# Patient Record
Sex: Male | Born: 1950 | Race: White | Hispanic: No | Marital: Married | Smoking: Never smoker
Health system: Southern US, Community
[De-identification: ages and names within clinical notes are randomized; demographics above are authoritative.]

## PROBLEM LIST (undated history)

## (undated) DIAGNOSIS — K219 Gastro-esophageal reflux disease without esophagitis: Secondary | ICD-10-CM

---

## 2018-02-07 ENCOUNTER — Emergency Department: Payer: Medicare Other

## 2018-02-07 ENCOUNTER — Encounter: Payer: Self-pay | Admitting: *Deleted

## 2018-02-07 ENCOUNTER — Emergency Department
Admission: EM | Admit: 2018-02-07 | Discharge: 2018-02-07 | Disposition: A | Discharge status: Home / Self Care | Payer: Medicare Other | Attending: Emergency Medicine | Admitting: Emergency Medicine

## 2018-02-07 ENCOUNTER — Other Ambulatory Visit: Payer: Self-pay

## 2018-02-07 DIAGNOSIS — R42 Dizziness and giddiness: Secondary | ICD-10-CM | POA: Diagnosis present

## 2018-02-07 HISTORY — DX: Gastro-esophageal reflux disease without esophagitis: K21.9

## 2018-02-07 LAB — BASIC METABOLIC PANEL
ANION GAP: 9 (ref 5–15)
BUN: 22 mg/dL — ABNORMAL HIGH (ref 6–20)
CALCIUM: 8.8 mg/dL — AB (ref 8.9–10.3)
CO2: 22 mmol/L (ref 22–32)
Chloride: 105 mmol/L (ref 101–111)
Creatinine, Ser: 1 mg/dL (ref 0.61–1.24)
GFR calc Af Amer: >60 mL/min (ref >60)
Glucose, Bld: 163 mg/dL — ABNORMAL HIGH (ref 65–99)
POTASSIUM: 3.3 mmol/L — AB (ref 3.5–5.1)
SODIUM: 136 mmol/L (ref 135–145)

## 2018-02-07 LAB — CBC
HCT: 43.7 % (ref 40.0–52.0)
HEMOGLOBIN: 15.1 g/dL (ref 13.0–18.0)
MCH: 31.2 pg (ref 26.0–34.0)
MCHC: 34.6 g/dL (ref 32.0–36.0)
MCV: 90.3 fL (ref 80.0–100.0)
Platelets: 207 10*3/uL (ref 150–440)
RBC: 4.84 MIL/uL (ref 4.40–5.90)
RDW: 14.2 % (ref 11.5–14.5)
WBC: 9.2 10*3/uL (ref 3.8–10.6)

## 2018-02-07 LAB — GLUCOSE, CAPILLARY: GLUCOSE-CAPILLARY: 168 mg/dL — AB (ref 65–99)

## 2018-02-07 MED ORDER — DIAZEPAM 5 MG PO TABS: 5.0000 mg | ORAL_TABLET | Freq: Three times a day (TID) | ORAL | 0 refills | Status: AC | PRN

## 2018-02-07 MED ORDER — SODIUM CHLORIDE 0.9 % IV SOLN
Freq: Once | INTRAVENOUS | Status: AC
  Administered 2018-02-07: 11:00:00 via INTRAVENOUS

## 2018-02-07 MED ORDER — DIAZEPAM 5 MG PO TABS
5.0000 mg | ORAL_TABLET | Freq: Once | ORAL | Status: AC
  Administered 2018-02-07: 5 mg via ORAL
  Filled 2018-02-07: qty 1

## 2018-02-07 MED ORDER — MECLIZINE HCL 25 MG PO TABS
50.0000 mg | ORAL_TABLET | Freq: Once | ORAL | Status: AC
Start: 2018-02-07 — End: 2018-02-07
  Administered 2018-02-07: 50 mg via ORAL
  Filled 2018-02-07: qty 2

## 2018-02-07 MED ORDER — ONDANSETRON HCL 4 MG/2ML IJ SOLN
INTRAMUSCULAR | Status: AC
  Administered 2018-02-07: 4 mg via INTRAVENOUS
  Filled 2018-02-07: qty 2

## 2018-02-07 MED ORDER — MECLIZINE HCL 25 MG PO TABS: 25.0000 mg | ORAL_TABLET | Freq: Three times a day (TID) | ORAL | 1 refills | Status: AC | PRN

## 2018-02-07 MED ORDER — ONDANSETRON HCL 4 MG/2ML IJ SOLN
4.0000 mg | Freq: Once | INTRAMUSCULAR | Status: AC
  Administered 2018-02-07: 4 mg via INTRAVENOUS

## 2018-02-07 NOTE — ED Notes (Signed)
CBG 156 with EMS

## 2018-02-07 NOTE — ED Notes (Signed)
Pt was able to ambulate to bathroom and around the room with no problem. No distress or dizziness.

## 2018-02-07 NOTE — ED Notes (Signed)
Pt O2 sat noted to be 82% on RA. Pt awake, alert, sitting upright. Good waveform present. Placed on 2L n/c with O2 sat increased to 97%. EDP Williams notified.

## 2018-02-07 NOTE — ED Provider Notes (Signed)
[facility] Emergency Department Provider Note       Time seen: ----------------------------------------- 10:14 AM on 02/07/2018 -----------------------------------------   I have reviewed the triage vital signs and the nursing notes.  HISTORY   Chief Complaint Dizziness    HPI Dale Mcguire is a 67 y.o. male with a history of GERD who presents to the ED for dizziness that started at 745 this morning.  Patient describes nausea and vomiting as well.  Symptoms persist at this time, there is no history of vertigo and he was not orthostatic when checked by EMS.  He is not having any neurologic deficits such as speech or vision difficulty, numbness, tingling or weakness.  He was given Zofran in route by EMS with some relief in his nausea.  Patient feels like he has no balance.  Past Medical History:  Diagnosis Date  . GERD (gastroesophageal reflux disease)     There are no active problems to display for this patient.   History reviewed. No pertinent surgical history.  Allergies Patient has no allergy information on record.  Social History Social History   Tobacco Use  . Smoking status: Never Smoker  . Smokeless tobacco: Never Used  Substance Use Topics  . Alcohol use: Not Currently  . Drug use: Never   Review of Systems Constitutional: Negative for fever. Cardiovascular: Negative for chest pain. Respiratory: Negative for shortness of breath. Gastrointestinal: Negative for abdominal pain, vomiting and diarrhea. Musculoskeletal: Negative for back pain. Skin: Negative for rash. Neurological: Negative for headaches, focal weakness or numbness.  Positive for dizziness and ataxia  All systems negative/normal/unremarkable except as stated in the HPI  ____________________________________________   PHYSICAL EXAM:  VITAL SIGNS: ED Triage Vitals  Enc Vitals Group     BP 02/07/18 1000 120/73     Pulse Rate 02/07/18 1000 (!) 55     Resp 02/07/18  1000 17     Temp --      Temp src --      SpO2 02/07/18 1000 99 %     Weight 02/07/18 1001 236 lb (107 kg)     Height 02/07/18 1001 6' (1.829 m)     Head Circumference --      Peak Flow --      Pain Score 02/07/18 1001 0     Pain Loc --      Pain Edu? --      Excl. in GC? --    Constitutional: Alert and oriented. Well appearing and in no distress. Eyes: Conjunctivae are normal.  Horizontal nystagmus is noted ENT   Head: Normocephalic and atraumatic.   Nose: No congestion/rhinnorhea.   Mouth/Throat: Mucous membranes are moist.   Neck: No stridor. Cardiovascular: Normal rate, regular rhythm. No murmurs, rubs, or gallops. Respiratory: Normal respiratory effort without tachypnea nor retractions. Breath sounds are clear and equal bilaterally. No wheezes/rales/rhonchi. Gastrointestinal: Soft and nontender. Normal bowel sounds Musculoskeletal: Nontender with normal range of motion in extremities. No lower extremity tenderness nor edema. Neurologic:  Normal speech and language. No gross focal neurologic deficits are appreciated.  Strength, sensation, cranial nerves are normal Skin:  Skin is warm, dry and intact. No rash noted. Psychiatric: Mood and affect are normal. Speech and behavior are normal.  ____________________________________________  EKG: Interpreted by me.  Sinus rhythm the rate of 56 bpm, normal PR interval, normal QRS, normal QT.  ____________________________________________  ED COURSE:  As part of my medical decision making, I reviewed the following data within the electronic medical  record:  History obtained from family if available, nursing notes, old chart and ekg, as well as notes from prior ED visits. Patient presented for dizziness, we will assess with labs and imaging as indicated at this time. Clinical Course as of Feb 08 1331  Thu Feb 07, 2018  1234 Patient reports improvement in his dizziness at this time.   [JW]    Clinical Course User  Index [JW] Emily Filbert, MD   Procedures ____________________________________________   LABS (pertinent positives/negatives)  Labs Reviewed  BASIC METABOLIC PANEL - Abnormal; Notable for the following components:      Result Value   Potassium 3.3 (*)    Glucose, Bld 163 (*)    BUN 22 (*)    Calcium 8.8 (*)    All other components within normal limits  GLUCOSE, CAPILLARY - Abnormal; Notable for the following components:   Glucose-Capillary 168 (*)    All other components within normal limits  CBC  URINALYSIS, COMPLETE (UACMP) WITH MICROSCOPIC  CBG MONITORING, ED    RADIOLOGY Images were viewed by me  CT head IMPRESSION: No acute intracranial abnormalities. ____________________________________________  DIFFERENTIAL DIAGNOSIS   Peripheral vertigo, central vertigo, dehydration, electrolyte abnormality  FINAL ASSESSMENT AND PLAN  Vertigo   Plan: The patient had presented for symptoms of peripheral vertigo. Patient's labs were unremarkable. Patient's imaging was also unremarkable.  He was able to ambulate here and felt much better after fluids, meclizine and Valium.  He will be discharged home with similar and will be referred to ENT for follow-up.   Ulice Dash, MD   Note: This note was generated in part or whole with voice recognition software. Voice recognition is usually quite accurate but there are transcription errors that can and very often do occur. I apologize for any typographical errors that were not detected and corrected.     Emily Filbert, MD 02/07/18 [phone]

## 2018-02-07 NOTE — ED Triage Notes (Signed)
Pt to ED via EMS reporting dizziness that started at 745 this morning. NV present as well. Dizziness persists at this time. No hx of vertigo. Orthostatic negative with EMS. NO neuro deficits noted. NO reports of pain and no cardiac hx reported.   of Zofran given with EMS. Pt reports relief.

## 2019-04-02 IMAGING — CT CT HEAD W/O CM
3 series · 16 of 47 positions shown, 19 images · non-contrast
Comparison: None

CLINICAL DATA: Dizziness, nausea and vomiting beginning this
morning,

EXAM:
CT HEAD WITHOUT CONTRAST
TECHNIQUE: Contiguous axial images were obtained from the base of the skull
through the vertex without intravenous contrast. Sagittal and
coronal MPR images reconstructed from axial data set.

[Series 2: head wo · axial · 0.46mm/px · z∈[-76,+54]mm · 10 of 32 slices shown, 13 images]
[im 3/32  brain]
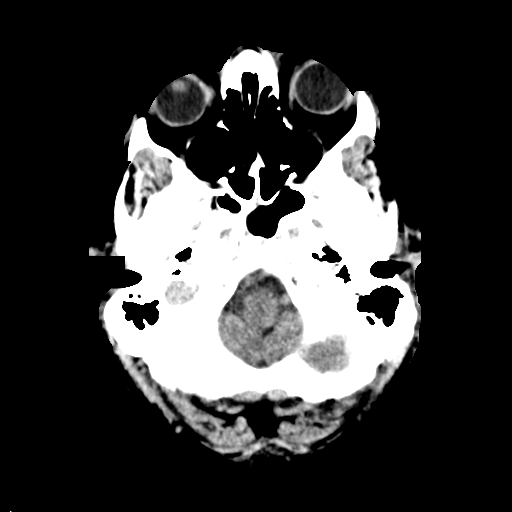
[im 3/32  bone]
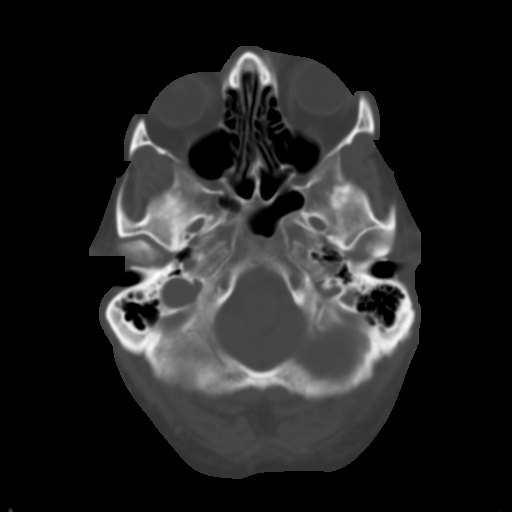
[im 6/32  brain]
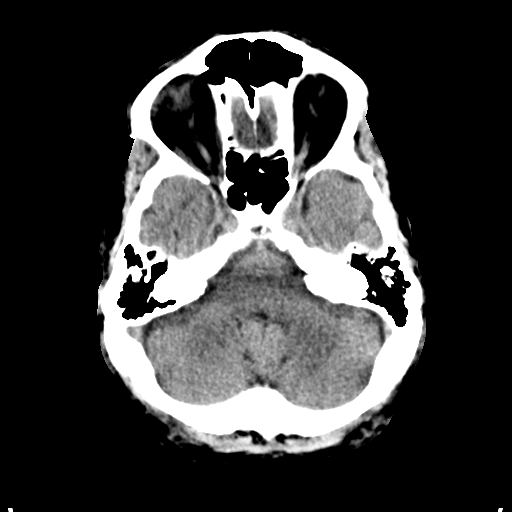
[im 9/32  brain]
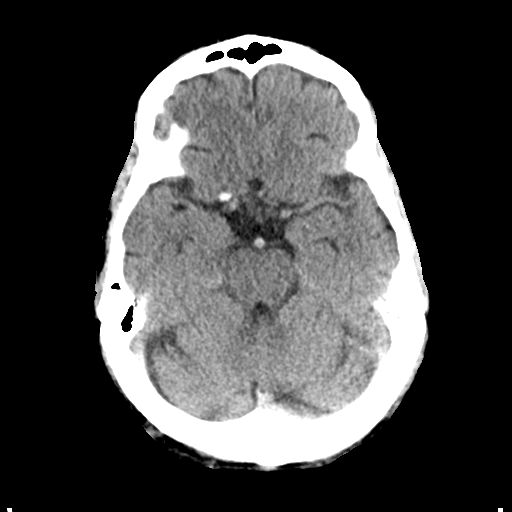
[im 11/32  brain]
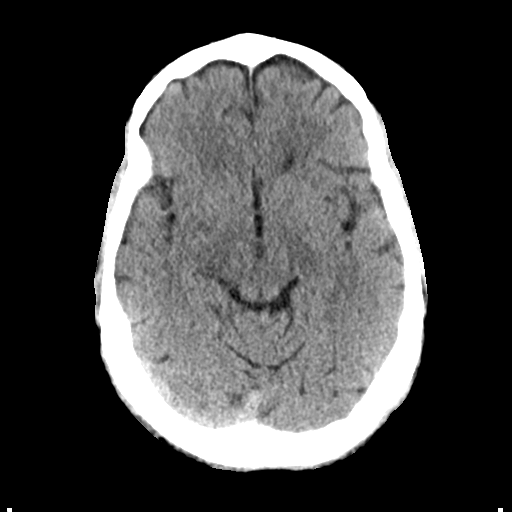
[im 14/32  brain]
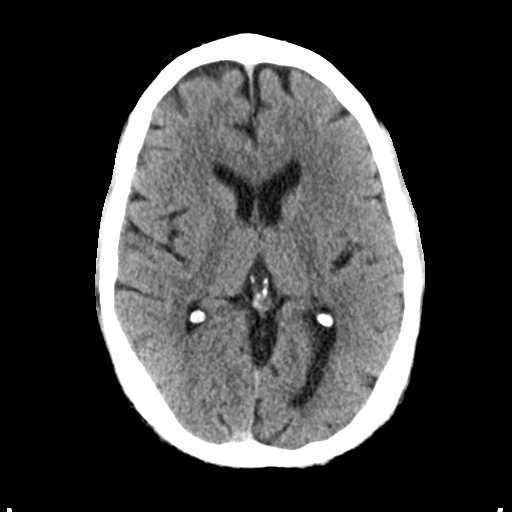
[im 14/32  bone]
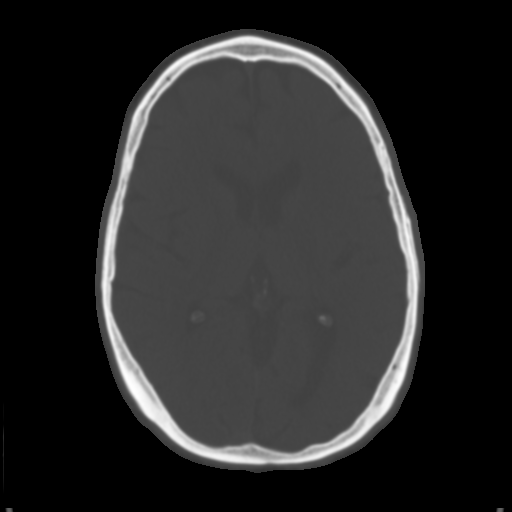
[im 18/32  brain]
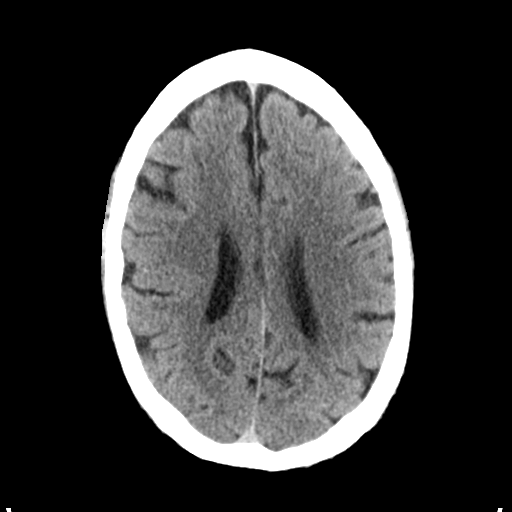
[im 21/32  brain]
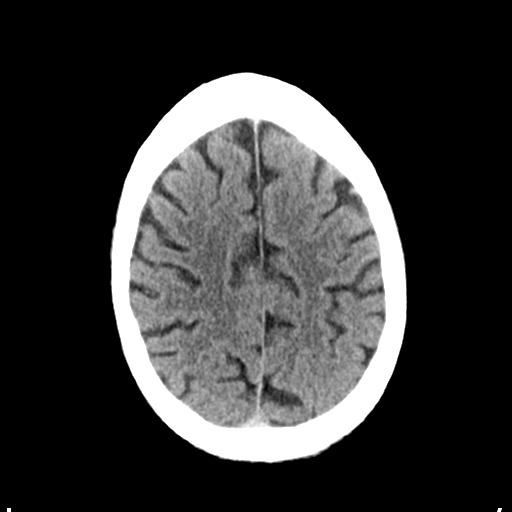
[im 24/32  brain]
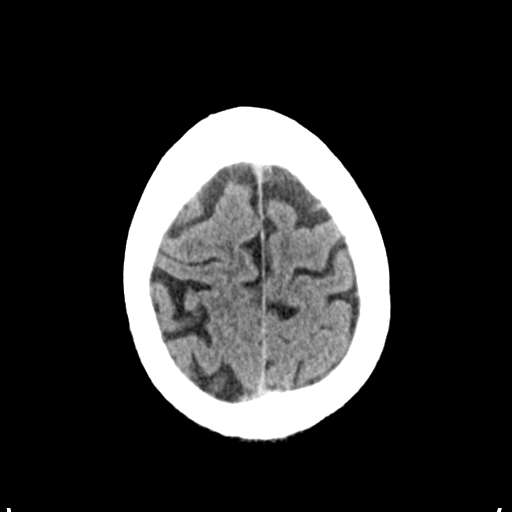
[im 26/32  brain]
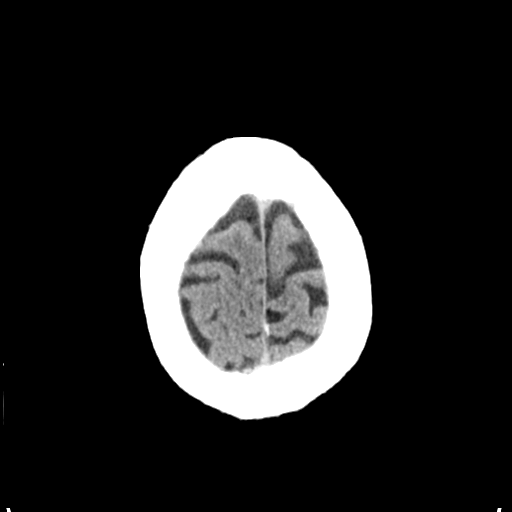
[im 26/32  bone]
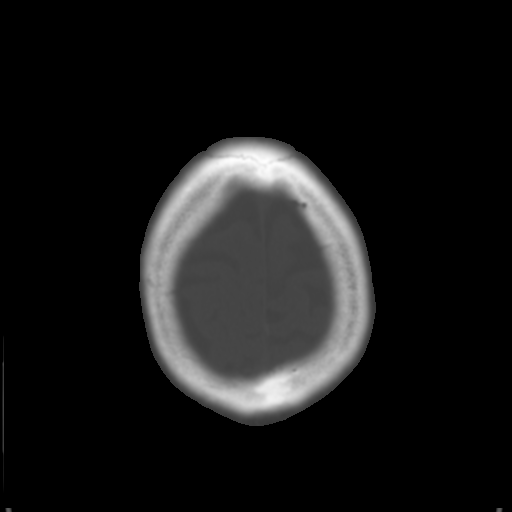
[im 29/32  brain]
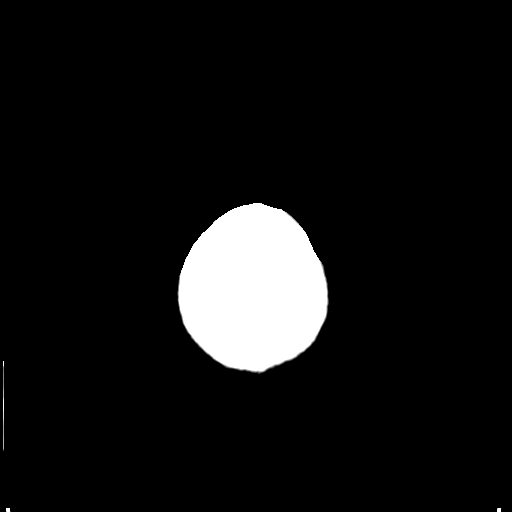

[Series 4: coronal soft tissue · coronal · 0.33mm/px · 3 of 66 slices shown]
[im 22/66  brain]
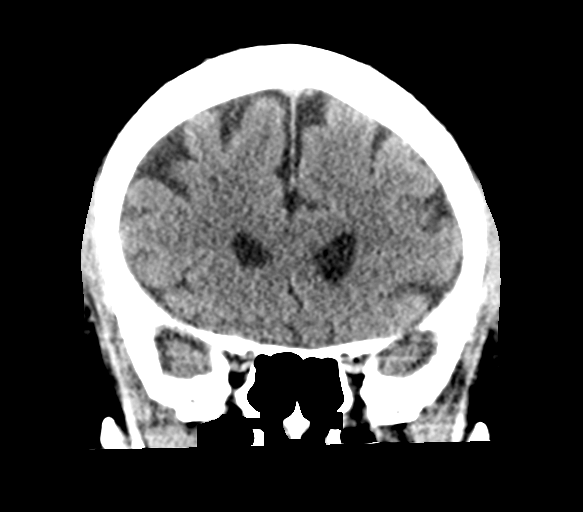
[im 29/66  brain]
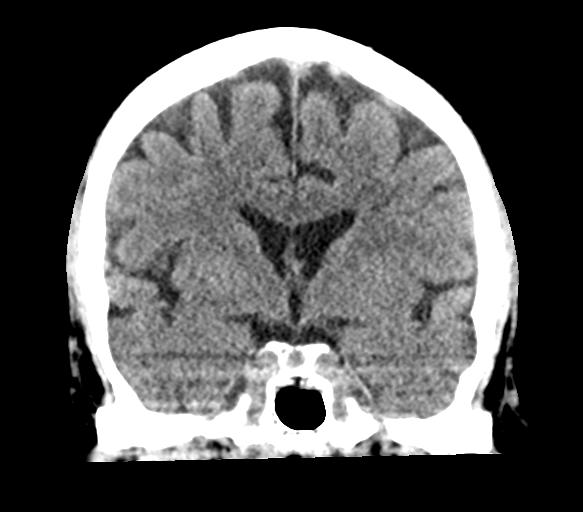
[im 37/66  brain]
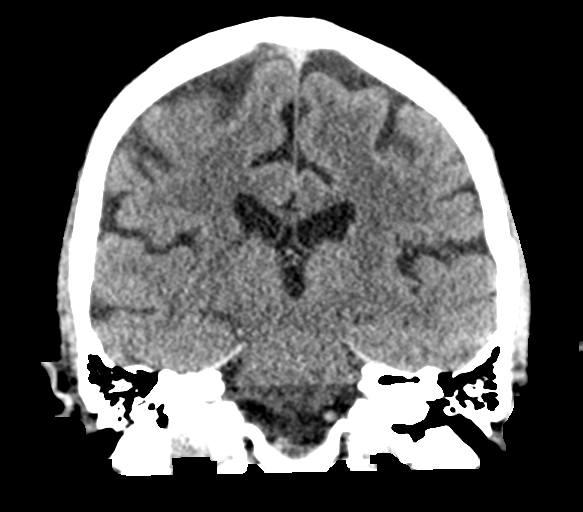

[Series 5: sagittal soft tissue · sagittal · 0.32mm/px · 3 of 54 slices shown]
[im 18/54  brain]
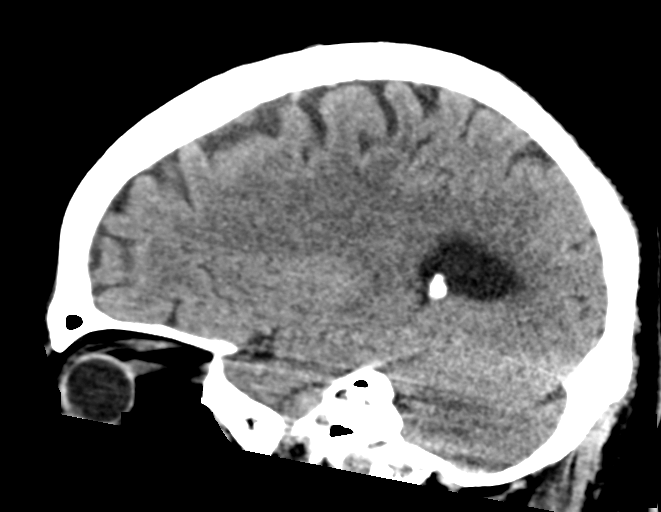
[im 27/54  brain]
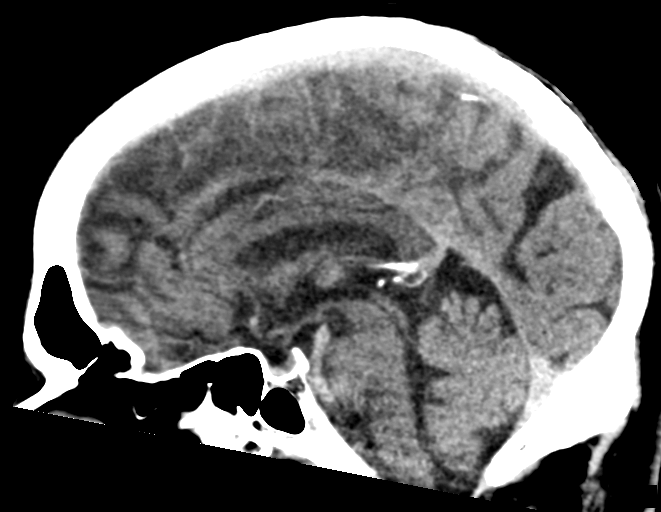
[im 36/54  brain]
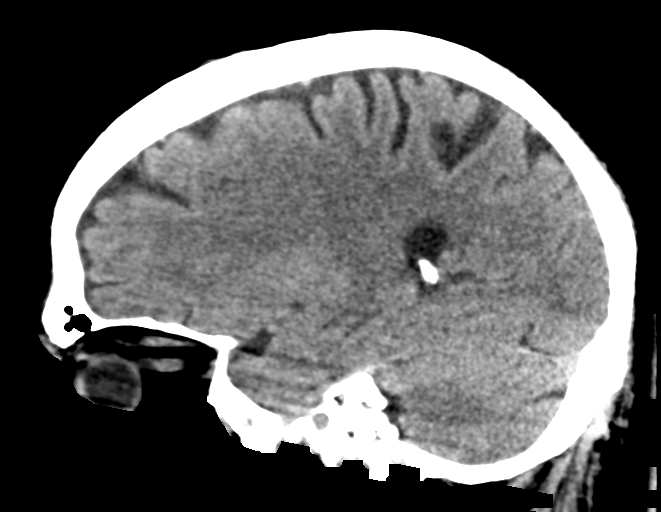

[16 of 47 positions shown; findings below may reference images not displayed]

FINDINGS: Brain: Minimal atrophy. Normal ventricular morphology. No midline
shift or mass effect. Otherwise normal appearance of brain
parenchyma. No intracranial hemorrhage, mass lesion or evidence
acute infarction. No extra-axial fluid collections.

Vascular: No hyperdense vessels

Skull: Intact

Sinuses/Orbits: Clear

Other: N/A
IMPRESSION: No acute intracranial abnormalities.
